# Patient Record
Sex: Male | Born: 1990 | Race: White | Hispanic: Yes | Marital: Single | Smoking: Current every day smoker
Health system: Southern US, Community
[De-identification: ages and names within clinical notes are randomized; demographics above are authoritative.]

## PROBLEM LIST (undated history)

## (undated) DIAGNOSIS — S43006A Unspecified dislocation of unspecified shoulder joint, initial encounter: Secondary | ICD-10-CM

## (undated) DIAGNOSIS — T7840XA Allergy, unspecified, initial encounter: Secondary | ICD-10-CM

## (undated) HISTORY — PX: WRIST FRACTURE SURGERY: SHX121

## (undated) HISTORY — DX: Allergy, unspecified, initial encounter: T78.40XA

---
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
## 2012-09-21 ENCOUNTER — Encounter (HOSPITAL_COMMUNITY): Payer: Self-pay | Admitting: *Deleted

## 2012-09-21 ENCOUNTER — Emergency Department (HOSPITAL_COMMUNITY)
Admission: EM | Admit: 2012-09-21 | Discharge: 2012-09-22 | Disposition: A | Discharge status: Home / Self Care | Payer: Self-pay | Attending: Emergency Medicine | Admitting: Emergency Medicine

## 2012-09-21 DIAGNOSIS — X500XXA Overexertion from strenuous movement or load, initial encounter: Secondary | ICD-10-CM | POA: Insufficient documentation

## 2012-09-21 DIAGNOSIS — S43015A Anterior dislocation of left humerus, initial encounter: Secondary | ICD-10-CM

## 2012-09-21 DIAGNOSIS — Y9389 Activity, other specified: Secondary | ICD-10-CM | POA: Insufficient documentation

## 2012-09-21 DIAGNOSIS — Y9289 Other specified places as the place of occurrence of the external cause: Secondary | ICD-10-CM | POA: Insufficient documentation

## 2012-09-21 DIAGNOSIS — F17200 Nicotine dependence, unspecified, uncomplicated: Secondary | ICD-10-CM | POA: Insufficient documentation

## 2012-09-21 DIAGNOSIS — S43016A Anterior dislocation of unspecified humerus, initial encounter: Secondary | ICD-10-CM | POA: Insufficient documentation

## 2012-09-21 HISTORY — DX: Unspecified dislocation of unspecified shoulder joint, initial encounter: S43.006A

## 2012-09-21 NOTE — ED Notes (Signed)
Pt states that he was getting ready for bed and his left shoulder "fell out" of place; pt states that he has had left shoulder dislocation x 2 in the past

## 2012-09-22 ENCOUNTER — Emergency Department (HOSPITAL_COMMUNITY): Payer: Self-pay

## 2012-09-22 MED ORDER — KETAMINE HCL 10 MG/ML IJ SOLN
INTRAMUSCULAR | Status: AC | PRN
  Administered 2012-09-22: 40.8 mg via INTRAVENOUS

## 2012-09-22 MED ORDER — KETAMINE HCL 10 MG/ML IJ SOLN
0.5000 mg/kg | Freq: Once | INTRAMUSCULAR | Status: AC
  Administered 2012-09-22: 41 mg via INTRAVENOUS
  Filled 2012-09-22: qty 4.1

## 2012-09-22 MED ORDER — IBUPROFEN 600 MG PO TABS: 600.0000 mg | ORAL_TABLET | Freq: Four times a day (QID) | ORAL | Status: DC | PRN

## 2012-09-22 MED ORDER — PROPOFOL 10 MG/ML IV BOLUS
0.5000 mg/kg | Freq: Once | INTRAVENOUS | Status: AC
  Administered 2012-09-22: 40.8 mg via INTRAVENOUS
  Filled 2012-09-22: qty 1

## 2012-09-22 MED ORDER — PROPOFOL 10 MG/ML IV BOLUS
INTRAVENOUS | Status: AC | PRN
  Administered 2012-09-22: 40.8 mg via INTRAVENOUS

## 2012-09-22 NOTE — ED Provider Notes (Signed)
History     CSN: [id number]  Arrival date & time 09/21/12  2338   First MD Initiated Contact with Patient 09/22/12 0000      Chief Complaint  Patient presents with  . Shoulder Injury   HPI Brett Gillespie is a 22 y.o. male with prior orthopedic surgery to the left wrist, also with 2 prior left anterior shoulder dislocations presents with a left anterior shoulder dislocation. Patient is using right bed move his arm over his head and his left shoulder "fell out" of place.  Pain is severe, located in the left shoulder, does not radiate, is sharp, is worse on arm movement, is not associated with numbness or tingling over the lateral deltoid, is not associated with any new numbness or tingling of the distal left hand.   Past Medical History  Diagnosis Date  . Shoulder dislocation     x 2    Past Surgical History  Procedure Laterality Date  . Wrist fracture surgery      No family history on file.  History  Substance Use Topics  . Smoking status: Current Every Day Smoker -- 0.25 packs/day  . Smokeless tobacco: Not on file  . Alcohol Use: No      Review of Systems At least 10pt or greater review of systems completed and are negative except where specified in the HPI.  Allergies  Review of patient's allergies indicates not on file.  Home Medications  No current outpatient prescriptions on file.  BP 142/89  Pulse 80  Temp(Src) 98.4 F (36.9 C) (Oral)  Resp 16  Ht 5\' 8"  (1.727 m)  Wt 180 lb (81.647 kg)  BMI 27.38 kg/m2  SpO2 97%  Physical Exam  Nursing notes reviewed.  Electronic medical record reviewed. VITAL SIGNS:   Filed Vitals:   09/21/12 2351  BP: 142/89  Pulse: 80  Temp: 98.4 F (36.9 C)  TempSrc: Oral  Resp: 16  Height: 5\' 8"  (1.727 m)  Weight: 180 lb (81.647 kg)  SpO2: 97%   CONSTITUTIONAL: Awake, oriented, appears non-toxic HENT: Atraumatic, normocephalic, oral mucosa pink and moist, airway patent. Nares patent without drainage. External  ears normal. EYES: Conjunctiva clear, EOMI, PERRLA NECK: Trachea midline, non-tender, supple CARDIOVASCULAR: Normal heart rate, Normal rhythm, No murmurs, rubs, gallops PULMONARY/CHEST: Clear to auscultation, no rhonchi, wheezes, or rales. Symmetrical breath sounds. Non-tender. ABDOMINAL: Non-distended, soft, non-tender - no rebound or guarding.  BS normal. NEUROLOGIC: Non-focal, moving all four extremities, no gross sensory or motor deficits. EXTREMITIES: No clubbing, cyanosis, or edema. Left shoulder is anteriorly dislocated, palpation of the humeral head anterior to the glenoid. No numbness or tingling in the lateral deltoid, grip strength is 5 out of 5, no wrist drop, left hand is neurovascularly intact. Pulses are 2+ radial and ulnar. SKIN: Warm, Dry, No erythema, No rash  ED Course  Reduction of dislocation Date/Time: 09/22/2012 1:04 AM Performed by: Jones Skene Authorized by: Jones Skene Consent: Verbal consent obtained. written consent obtained. Risks and benefits: risks, benefits and alternatives were discussed Consent given by: patient Patient understanding: patient states understanding of the procedure being performed Patient consent: the patient's understanding of the procedure matches consent given Procedure consent: procedure consent matches procedure scheduled Relevant documents: relevant documents present and verified Imaging studies: imaging studies available Patient identity confirmed: verbally with patient and arm band Time out: Immediately prior to procedure a "time out" was called to verify the correct patient, procedure, equipment, support staff and site/side marked as required. Local anesthesia used:  no Patient sedated: yes Sedatives: propofol Analgesia: ketamine Sedation start date/time: 09/22/2012 12:53 AM Sedation end date/time: 09/22/2012 1:04 AM  Procedural sedation Date/Time: 09/22/2012 1:05 AM Performed by: Jones Skene Authorized by: Jones Skene Consent: Verbal consent obtained. written consent obtained. Risks and benefits: risks, benefits and alternatives were discussed Consent given by: patient Patient understanding: patient states understanding of the procedure being performed Patient consent: the patient's understanding of the procedure matches consent given Procedure consent: procedure consent matches procedure scheduled Imaging studies: imaging studies available Patient identity confirmed: verbally with patient and arm band Time out: Immediately prior to procedure a "time out" was called to verify the correct patient, procedure, equipment, support staff and site/side marked as required. Patient sedated: yes Sedatives: propofol and ketamine Analgesia: ketamine Sedation start date/time: 09/22/2012 12:53 AM Sedation end date/time: 09/22/2012 1:04 AM   (including critical care time)  Labs Reviewed - No data to display Dg Shoulder Left Port  09/22/2012  *RADIOLOGY REPORT*  Clinical Data: 22 year old male status post reduction of left shoulder dislocation.  PORTABLE LEFT SHOULDER - 2+ VIEW  Comparison: 0012 hours the same day.  Findings: Portable post reduction frontal and scapular Y views. The left glenohumeral joint has been reduced.  Mild Hill-Sachs deformity of the proximal left humerus.  Left scapula appears intact.  Left clavicle and visualized ribs remain intact.  IMPRESSION: Reduced left glenohumeral dislocation.  Mild Hill-Sachs deformity. No scapular fracture identified.   Original Report Authenticated By: Erskine Speed, M.D.    Dg Shoulder Left Port  09/22/2012  *RADIOLOGY REPORT*  Clinical Data: 22 year old male status post fall.  History of shoulder dislocation.  PORTABLE LEFT SHOULDER - 2+ VIEW  Comparison: None.  Findings: Portable views of the left shoulder demonstrate anterior, sub coracoid glenohumeral dislocation.  The humeral head appears mildly impacted on the glenoid.  No definite fracture identified. Left  clavicle intact.  Visualized left ribs and lung parenchyma within normal limits.  IMPRESSION: Anterior left glenohumeral joint dislocation with mild impaction of the humeral head on the glenoid.   Original Report Authenticated By: Erskine Speed, M.D.      1. Anterior shoulder dislocation, left, initial encounter       MDM  Brett Gillespie is a 22 y.o. male presents with anterior shoulder dislocation. Patient is neurovascularly intact after his dislocation, discussed risks and benefits of moderate sedation and closed shoulder reduction. Make no guarantees that the shoulder would be reduced and would be without injuries. Patient agreed and consented.  Patient was sedated with Ketofol and shoulder reduced without incident.  Post reduction films obtained and shows possible mild Hill-Sachs deformity. Patient is neurovascularly intact after reduction.  Patient recovered from sedation completely. We discharged home with his parents. Patient to followup with Dr. Magnus Ivan - patient is urged to followup with orthopedics as future shoulder dislocations are likely to occur given his 3 anterior dislocations. Understands accepts medical plan questions have been answered         Jones Skene, MD 09/22/12 [phone]

## 2012-09-22 NOTE — ED Notes (Signed)
Duplicate charting of ketamine.  Only 40.8 mg given.

## 2012-09-22 NOTE — ED Notes (Signed)
Pt returned to baseline.  Per MD, ok to d/c home with family.

## 2012-09-22 NOTE — ED Notes (Signed)
MD at bedside. 

## 2013-07-12 ENCOUNTER — Ambulatory Visit: Payer: Self-pay | Admitting: Family Medicine

## 2013-07-12 VITALS — BP 128/80 | HR 82 | Temp 97.8°F | Resp 16 | Ht 68.0 in | Wt 214.0 lb

## 2013-07-12 DIAGNOSIS — J189 Pneumonia, unspecified organism: Secondary | ICD-10-CM

## 2013-07-12 DIAGNOSIS — H6690 Otitis media, unspecified, unspecified ear: Secondary | ICD-10-CM

## 2013-07-12 MED ORDER — AZITHROMYCIN 250 MG PO TABS: ORAL_TABLET | ORAL | Status: DC

## 2013-07-12 NOTE — Patient Instructions (Signed)
Use the azithromycin antibiotic as directed.  If you are not feeling better in the next few days please let me know- Sooner if worse.

## 2013-07-12 NOTE — Progress Notes (Signed)
[facility] [address], [place] [address] [phone]  Date:  07/12/2013   Name:  Brett Gillespie   DOB:  [birth year]   MRN:  [id number]  PCP:  No PCP Per Patient    Chief Complaint: Sore Throat and Cough   History of Present Illness:  Brett Gillespie is a 23 y.o. very pleasant male patient who presents with the following:  He is here today with illness.  He is generally healthy except for allergies.  For 2 weeks now he has noted an intermittent ST, cough productive of mucus, malaise.  He has not had a fever, chills, body aches or any GI symptoms.  He did notice decreased hearing in the right ear yesterday; it feels ok today and he has not had any ear pain.    He is a smoker.  He has used some nyquil and advil as needed  There are no active problems to display for this patient.   Past Medical History  Diagnosis Date  . Shoulder dislocation     x 2    Past Surgical History  Procedure Laterality Date  . Wrist fracture surgery      History  Substance Use Topics  . Smoking status: Current Every Day Smoker -- 0.25 packs/day  . Smokeless tobacco: Not on file  . Alcohol Use: No    No family history on file.  No Known Allergies  Medication list has been reviewed and updated.  Current Outpatient Prescriptions on File Prior to Visit  Medication Sig Dispense Refill  . ibuprofen (ADVIL,MOTRIN) 600 MG tablet Take 1 tablet (600 mg total) by mouth every 6 (six) hours as needed for pain.  30 tablet  0  . fexofenadine (ALLEGRA) 180 MG tablet Take 180 mg by mouth daily.       No current facility-administered medications on file prior to visit.    Review of Systems:  As per HPI- otherwise negative.   Physical Examination: Filed Vitals:   07/12/13 1506  BP: 142/80  Pulse: 82  Temp: 97.8 F (36.6 C)  Resp: 16   Filed Vitals:   07/12/13 1506  Height: 5\' 8"  (1.727 m)  Weight: 214 lb (97.07 kg)   Body mass index is 32.55  kg/(m^2). Ideal Body Weight: Weight in (lb) to have BMI = 25: 164.1  GEN: WDWN, NAD, Non-toxic, A & O x 3, looks well, overweight HEENT: Atraumatic, Normocephalic. Neck supple. No masses, No LAD.  Right TM is slightly injected but not bulging/ no inflammation. Left TM is normal, oropharynx normal.  PEERL,EOMI.  Nasal cavity is congested.  Ears and Nose: No external deformity. CV: RRR, No M/G/R. No JVD. No thrill. No extra heart sounds. PULM: CTA B, no wheezes or rhonchi. No retractions. No resp. distress. No accessory muscle use.  He has mild crackles right lower lobe EXTR: No c/c/e NEURO Normal gait.  PSYCH: Normally interactive. Conversant. Not depressed or anxious appearing.  Calm demeanor.    Assessment and Plan: Walking pneumonia - Plan: azithromycin (ZITHROMAX) 250 MG tablet  Otitis media - Plan: azithromycin (ZITHROMAX) 250 MG tablet  Here today with 2 weeks of sx.  Will treat with azithromycin for walking pneumonia and otitis.  He will let me know if not better in the next few days- Sooner if worse.     Signed Abbe AmsterdamJessica Ronald Vinsant, MD

## 2014-05-11 IMAGING — CR DG SHOULDER 1V*L*
1 series · 3 of 3 positions shown · non-contrast
Comparison: None.

CLINICAL DATA: 21-year-old male status post fall.  History of
shoulder dislocation.

PORTABLE LEFT SHOULDER - 2+ VIEW

[Series 1: ap int/ext rotation · left · 3 of 3 slices shown]
[im 1/3]
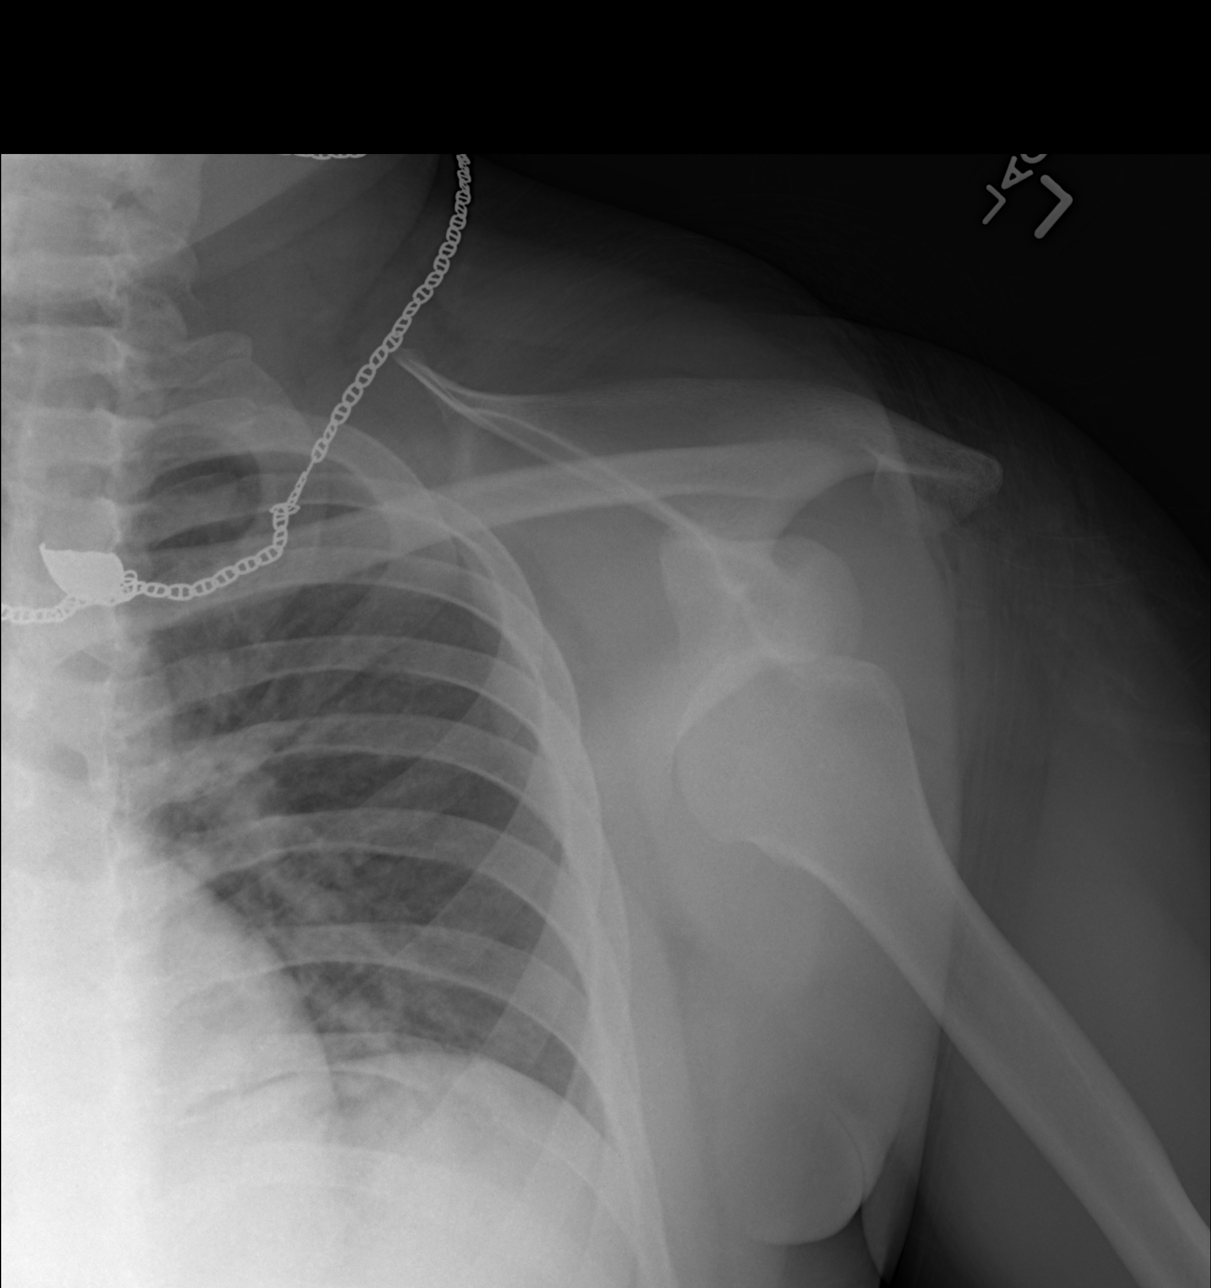
[im 2/3]
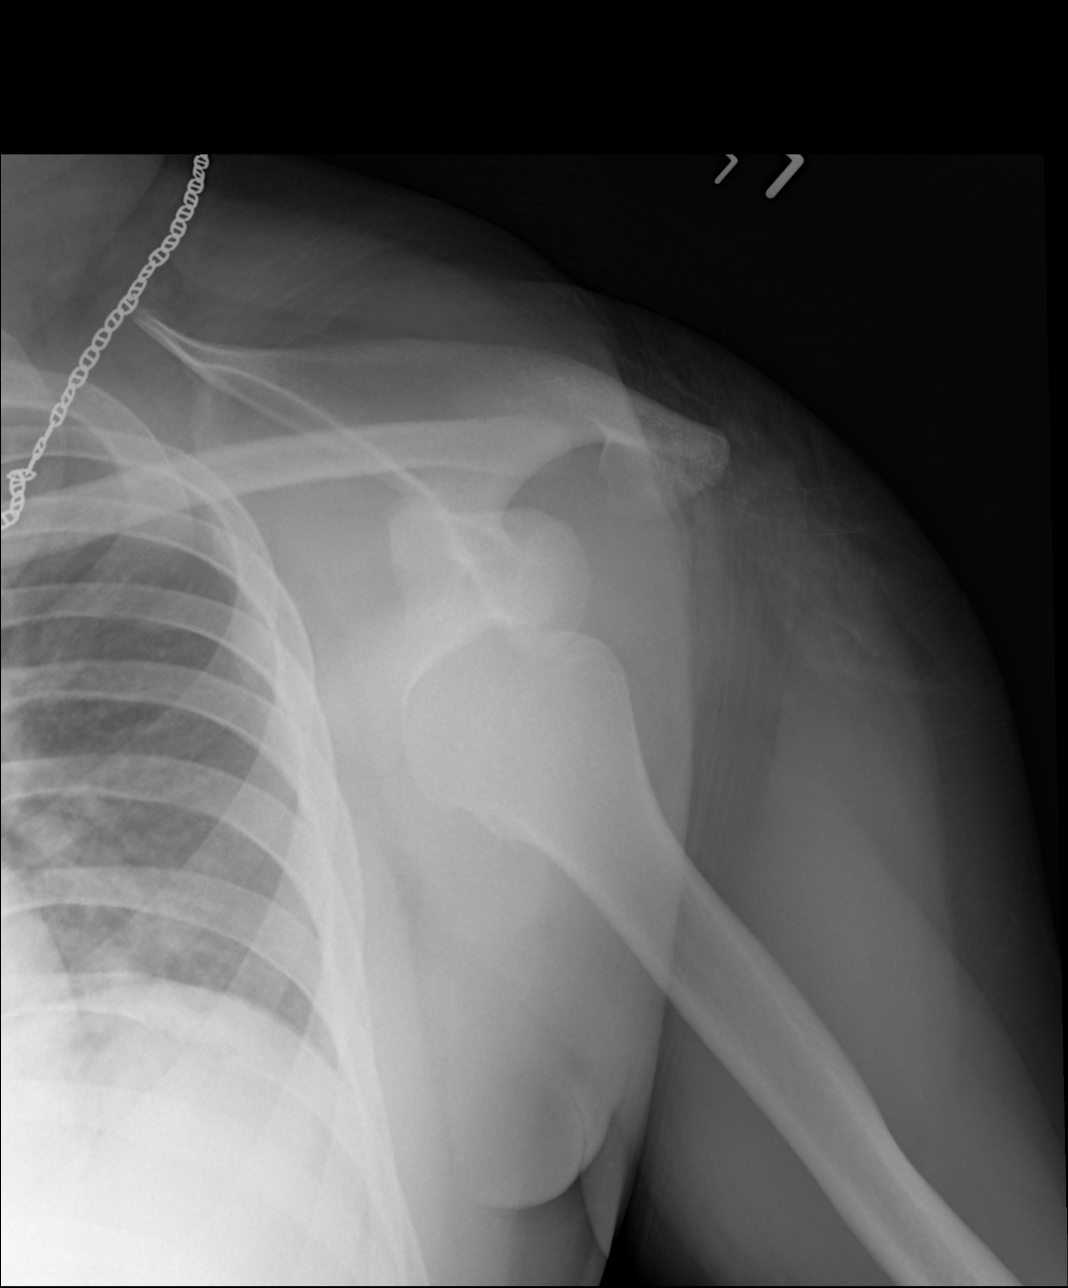
[im 3/3]
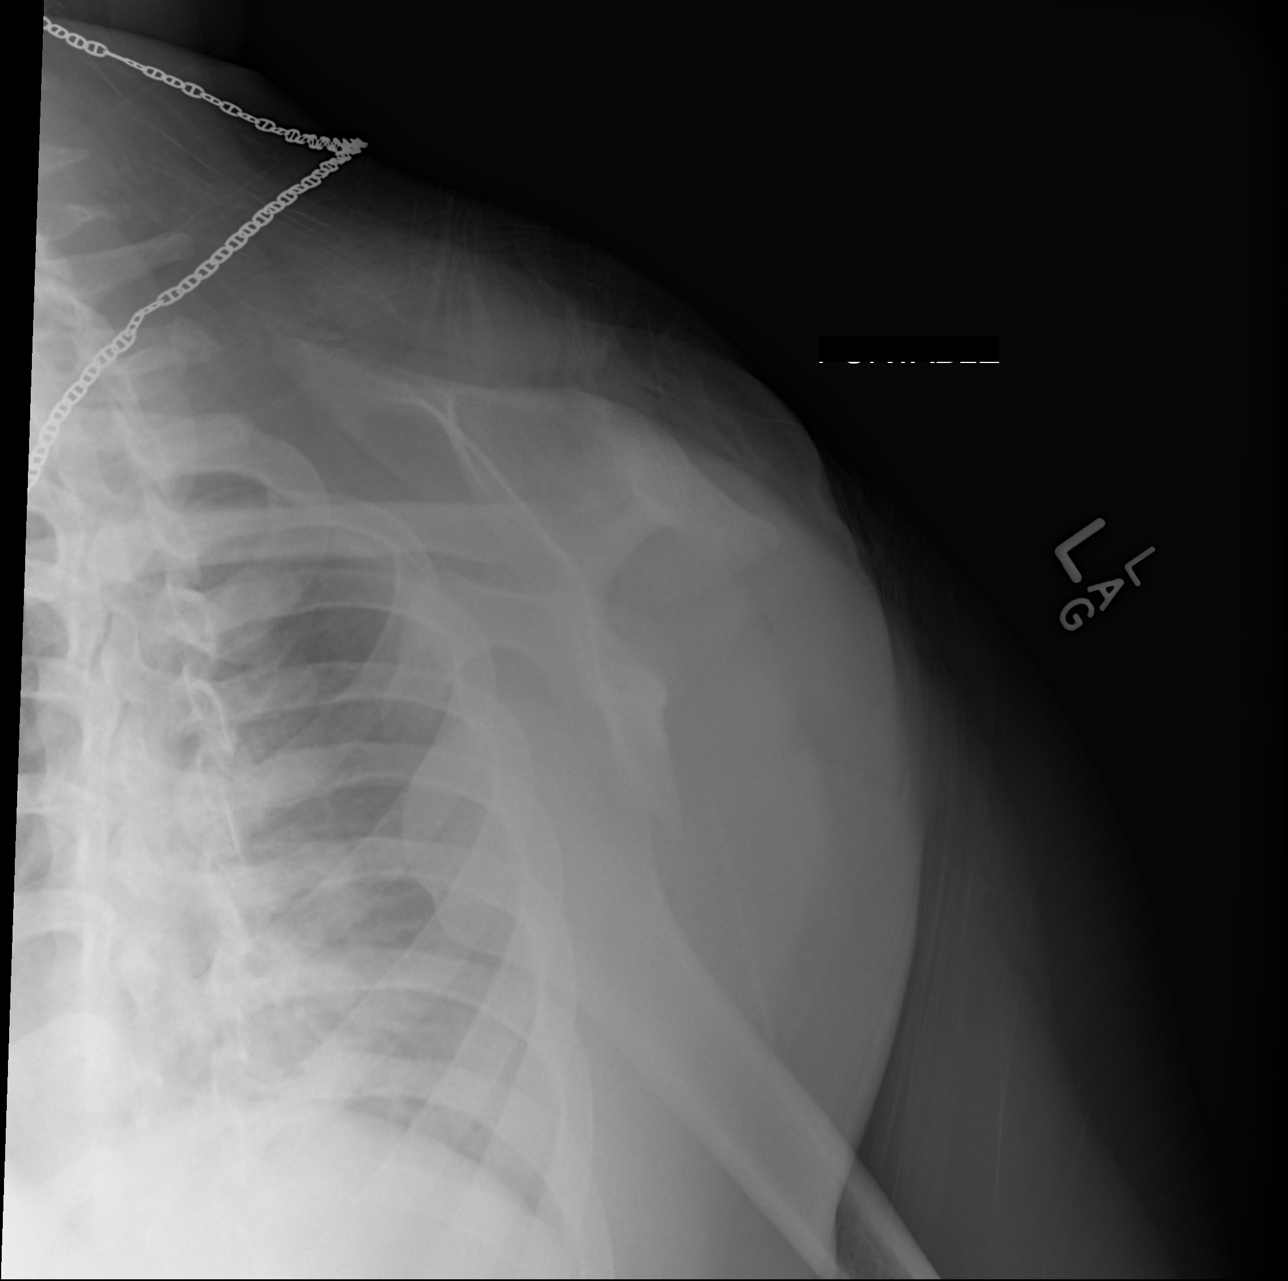

[3 of 3 positions shown; findings below may reference images not displayed]

FINDINGS: Portable views of the left shoulder demonstrate anterior,
sub coracoid glenohumeral dislocation.  The humeral head appears
mildly impacted on the glenoid.  No definite fracture identified.
Left clavicle intact.  Visualized left ribs and lung parenchyma
within normal limits.
IMPRESSION: Anterior left glenohumeral joint dislocation with mild impaction of
the humeral head on the glenoid.

## 2014-05-11 IMAGING — CR DG SHOULDER 1V*L*
1 series · 2 of 2 positions shown · non-contrast
Comparison: 7715 hours the same day.

CLINICAL DATA: 21-year-old male status post reduction of left
shoulder dislocation.

PORTABLE LEFT SHOULDER - 2+ VIEW

[Series 1: ap int/ext rotation · left · 2 of 2 slices shown]
[im 1/2]
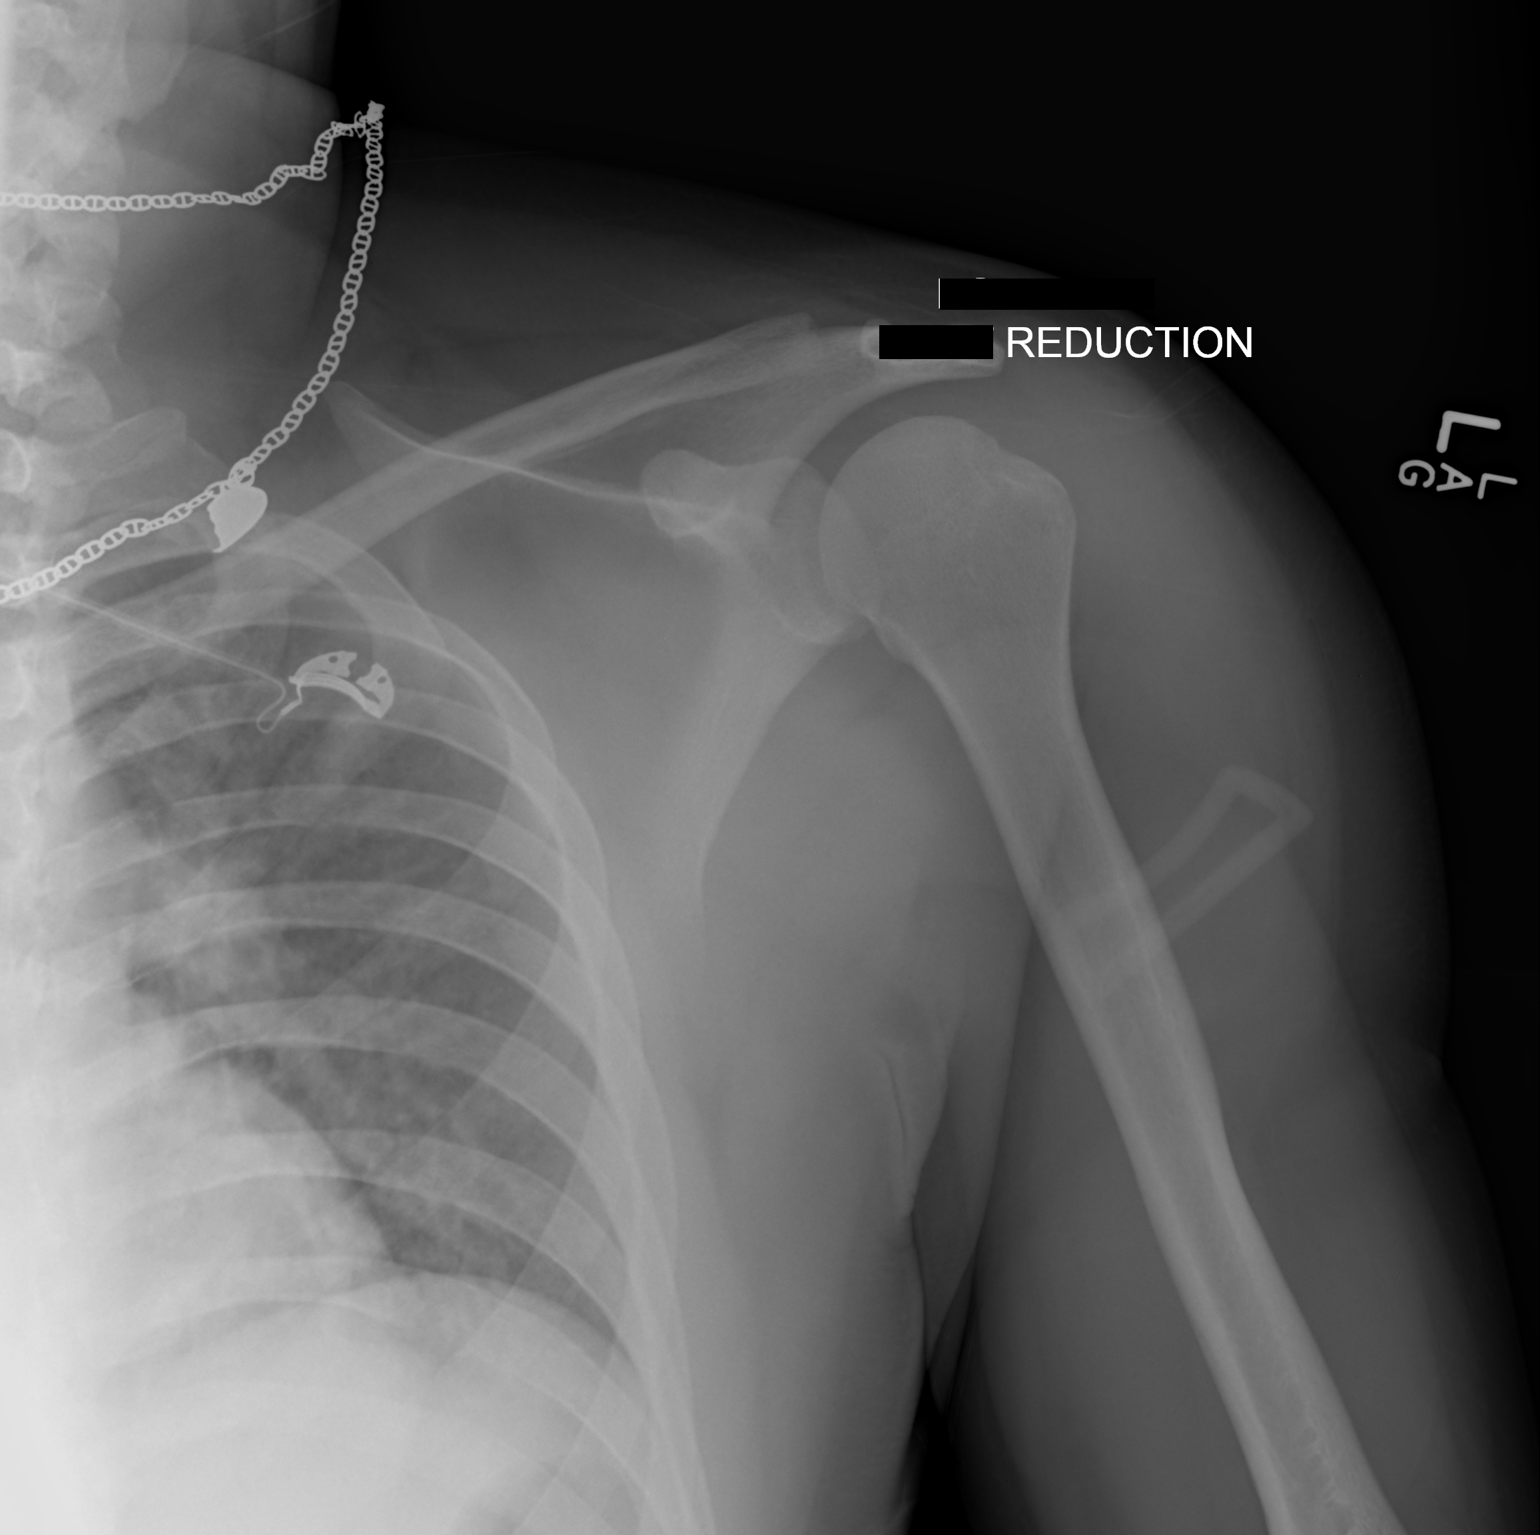
[im 2/2]
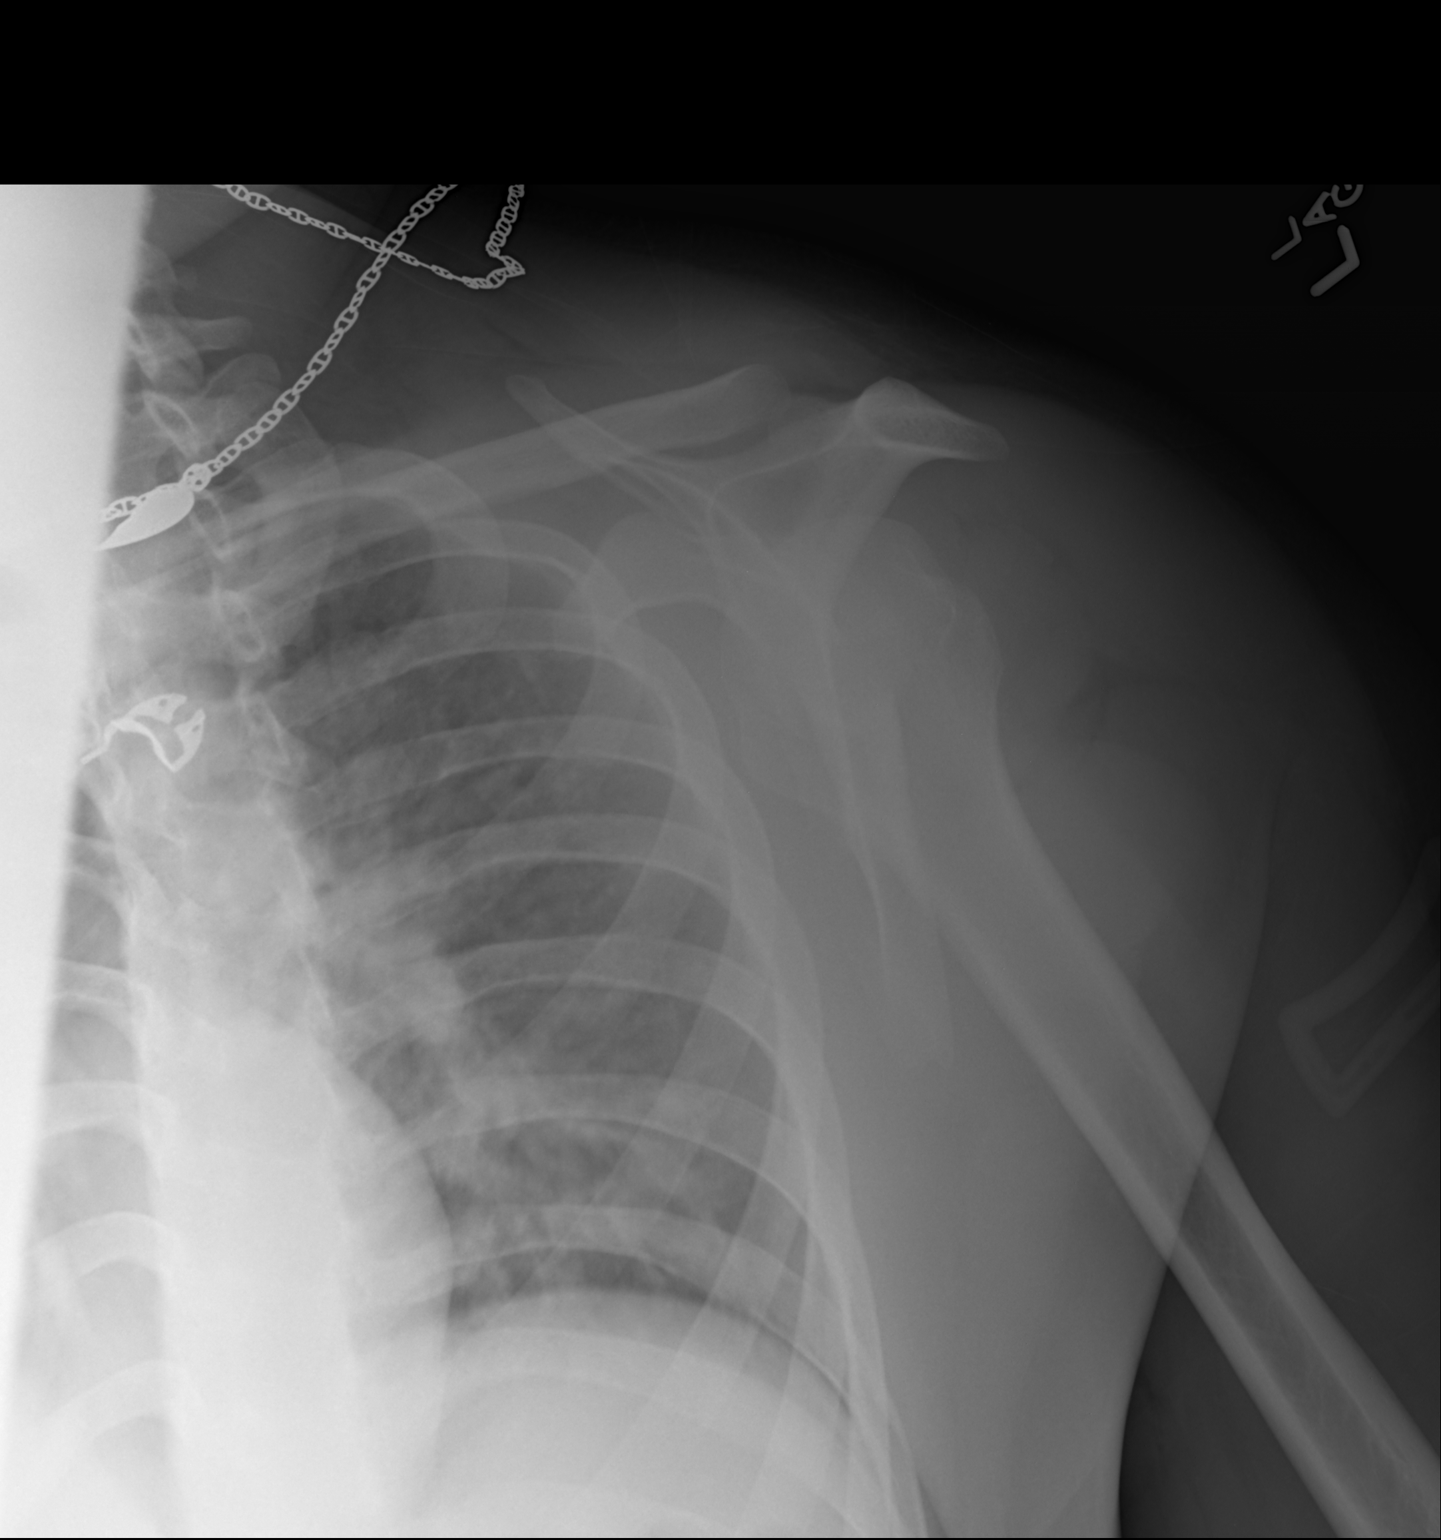

[2 of 2 positions shown; findings below may reference images not displayed]

FINDINGS: Portable post reduction frontal and scapular Y views.
The left glenohumeral joint has been reduced.  Mild Hill-Sachs
deformity of the proximal left humerus.  Left scapula appears
intact.  Left clavicle and visualized ribs remain intact.
IMPRESSION: Reduced left glenohumeral dislocation.  Mild Hill-Sachs deformity.
No scapular fracture identified.

## 2016-09-07 ENCOUNTER — Ambulatory Visit (INDEPENDENT_AMBULATORY_CARE_PROVIDER_SITE_OTHER): Payer: Self-pay | Admitting: Urgent Care

## 2016-09-07 VITALS — BP 143/85 | HR 78 | Temp 97.4°F | Resp 18 | Ht 68.11 in | Wt 216.0 lb

## 2016-09-07 DIAGNOSIS — K625 Hemorrhage of anus and rectum: Secondary | ICD-10-CM

## 2016-09-07 DIAGNOSIS — K602 Anal fissure, unspecified: Secondary | ICD-10-CM

## 2016-09-07 MED ORDER — NITROGLYCERIN 0.4 % RE OINT: 1.0000 "application " | TOPICAL_OINTMENT | Freq: Two times a day (BID) | RECTAL | 1 refills | Status: AC

## 2016-09-07 NOTE — Patient Instructions (Addendum)
Fisura anal en los adultos (Anal Fissure, Adult) Una fisura anal es un pequeo desgarro o un corte en la piel que rodea el ano. El sangrado proveniente de una fisura suele detenerse solo despus de algunos minutos. Pero generalmente, se repetir cada vez que defeque hasta que el corte cicatrice. CAUSAS Esta afeccin puede ser causada por lo siguiente:  Defecacin de material fecal (heces) dura y voluminosa.  Diarrea frecuente.  Estreimiento.  Enfermedad intestinal inflamatoria (enfermedad de Crohn o colitis ulcerosa).  Infecciones.  Sexo anal. SNTOMAS Los sntomas de esta afeccin incluyen lo siguiente:  Sangrado que proviene del recto.  Pequeas cantidades de sangre que se observan en las heces, en el papel higinico o en el inodoro despus de defecar.  Dolor al defecar.  Picazn o irritacin alrededor del ano. DIAGNSTICO  El mdico puede diagnosticar esta afeccin con un examen exhaustivo de la zona anal. Tyson Dense revisin cuidadosa permite observar la fisura anal. En algunos casos, se puede Museum/gallery conservator, o bien utilizar un tubo corto (anoscopio) para examinar el conducto anal. TRATAMIENTO  El tratamiento de esta afeccin puede incluir lo siguiente:  Tomar medidas para evitar el estreimiento, que pueden incluir cambios en la dieta, por ejemplo, aumentar la ingesta de fibras o de lquidos.  Tomar suplementos de [place], los cuales pueden ablandar las heces y ayudar a Museum/gallery conservator. El mdico tambin puede recetarle un ablandador de heces si estas suelen ser duras.  Tomar baos de asiento, que pueden ayudar a que el Merchandiser, retail.  Usar cremas o ungentos medicinales, que se pueden recetar para [employer]. INSTRUCCIONES PARA EL CUIDADO EN EL HOGAR Comida y bebida  No consuma los alimentos que pueden causar estreimiento, como las bananas y los productos lcteos.  Beba suficiente lquido para Photographer orina clara o de  color amarillo plido.  Siga una dieta que incluya gran cantidad de frutas, cereales integrales y verduras. Instrucciones generales  Mantenga la zona anal tan limpia y seca como sea posible.  Tome baos de asiento como se lo haya indicado el mdico. No use jabn en los baos de [place].  Tome los medicamentos de venta libre y los recetados solamente como se lo haya indicado el mdico.  Aplquese cremas o ungentos como se lo haya indicado el mdico.  Concurra a todas las visitas de control como se lo haya indicado el mdico. Esto es importante. SOLICITE ATENCIN MDICA SI:  Aumenta el sangrado.  Tiene fiebre.  Tiene diarrea mezclada con [place].  Sigue teniendo [employer]  El problema empeora en vez de mejorar. Esta informacin no tiene Theme park manager el consejo del mdico. Asegrese de hacerle al mdico cualquier pregunta que tenga. Document Released: 05/03/2005 Document Revised: 01/22/2015 Document Reviewed: 07/29/2014 Elsevier Interactive Patient Education  2017 [employer].     IF you received an x-ray today, you will receive an invoice from [facility]. Please contact [facility] at [phone] with questions or concerns regarding your invoice.   IF you received labwork today, you will receive an invoice from [facility]. Please contact [facility] at [phone] with questions or concerns regarding your invoice.   Our billing staff will not be able to assist you with questions regarding bills from these companies.  You will be contacted with the lab results as soon as they are available. The fastest way to get your results is to activate your My Chart account. Instructions are located on the last page of this paperwork. If you have not heard from  [place] regarding the results in 2 weeks, please contact this office.

## 2016-09-07 NOTE — Progress Notes (Signed)
  MRN: [id number] DOB: 1990/11/22  Subjective:   Brett Gillespie is a 26 y.o. male presenting for chief complaint of Blood In Stool (X 2 days )  Reports 2 day history of BRBPR and in the toilet while defecating. Reports 1 episode of severe constipation 1 month ago where he had to strain very hard to defecate. He does not eat fiber at all. Denies palpating mass, anal pain, straining when he defecates apart from the one episode, n/v, abdominal pain, diarrhea. Denies smoking cigarettes.  Brett Gillespie denies current medications. Also has No Known Allergies. Brett Gillespie  has a past medical history of Allergy and Shoulder dislocation. Also  has a past surgical history that includes Wrist fracture surgery.  Objective:   Vitals: BP (!) 143/85 (BP Location: Right Arm, Patient Position: Sitting, Cuff Size: Large)   Pulse 78   Temp 97.4 F (36.3 C) (Oral)   Resp 18   Ht 5' 8.11" (1.73 m)   Wt 216 lb (98 kg)   SpO2 96%   BMI 32.74 kg/m   Physical Exam  Constitutional: He is oriented to person, place, and time. He appears well-developed and well-nourished.  Cardiovascular: Normal rate.   Pulmonary/Chest: Effort normal.  Genitourinary: Rectal exam shows fissure (over area depicted) and tenderness (over anal fissure). Rectal exam shows no external hemorrhoid and no mass.     Neurological: He is alert and oriented to person, place, and time.    Assessment and Plan :   1. Anal fissure 2. Bright red blood per rectum - Recommended dietary modifications. Offered nitroglycerin topical to promote healing of anal fissure. Recheck if symptoms worsen or if they do not resolve after 8 weeks.  Wallis Bamberg, PA-C Primary Care at [facility] [phone] 09/07/2016  1:01 PM

## 2019-10-09 ENCOUNTER — Ambulatory Visit: Payer: Self-pay | Admitting: Registered Nurse
